# Patient Record
Sex: Female | Born: 1975 | Race: White | Hispanic: No | Marital: Married | Smoking: Never smoker
Health system: Southern US, Community
[De-identification: ages and names within clinical notes are randomized; demographics above are authoritative.]

## PROBLEM LIST (undated history)

## (undated) HISTORY — PX: TONSILLECTOMY: SUR1361

---

## 2017-05-05 ENCOUNTER — Other Ambulatory Visit: Payer: Self-pay

## 2017-05-05 ENCOUNTER — Emergency Department (INDEPENDENT_AMBULATORY_CARE_PROVIDER_SITE_OTHER): Payer: BLUE CROSS/BLUE SHIELD

## 2017-05-05 ENCOUNTER — Emergency Department
Admission: EM | Admit: 2017-05-05 | Discharge: 2017-05-05 | Disposition: A | Discharge status: Home / Self Care | Payer: BLUE CROSS/BLUE SHIELD | Source: Home / Self Care | Attending: Family Medicine | Admitting: Family Medicine

## 2017-05-05 ENCOUNTER — Telehealth: Payer: Self-pay

## 2017-05-05 DIAGNOSIS — R0789 Other chest pain: Secondary | ICD-10-CM

## 2017-05-05 DIAGNOSIS — R079 Chest pain, unspecified: Secondary | ICD-10-CM | POA: Diagnosis not present

## 2017-05-05 LAB — POCT CBC W AUTO DIFF (K'VILLE URGENT CARE)

## 2017-05-05 LAB — POCT URINALYSIS DIP (MANUAL ENTRY)
Bilirubin, UA: NEGATIVE
Glucose, UA: NEGATIVE mg/dL
LEUKOCYTES UA: NEGATIVE
NITRITE UA: NEGATIVE
PH UA: 6 (ref 5.0–8.0)
PROTEIN UA: NEGATIVE mg/dL
Spec Grav, UA: <=1.005 — AB (ref 1.010–1.025)
UROBILINOGEN UA: 0.2 U/dL

## 2017-05-05 LAB — POCT URINE PREGNANCY: PREG TEST UR: NEGATIVE

## 2017-05-05 LAB — D-DIMER, QUANTITATIVE (NOT AT ARMC): D DIMER QUANT: 0.48 ug{FEU}/mL (ref <0.50)

## 2017-05-05 NOTE — ED Provider Notes (Signed)
[facility]    CSN: [id number] Arrival date & time: 05/05/17  1425     History   Chief Complaint Chief Complaint  Patient presents with  . Chest Pain    left side under rib    HPI Denise Caldwell is a 42 y.o. female.   Two days ago patient suddenly developed left flank pain that has persisted.  She points to her left lateral inferior ribs. She has pain with inspiration, chest movement, supine in bed on her left side.  No shortness of breath or cough.  No recent URI.  No fevers, chills, and sweats.  The pain is also somewhat worse after eating.  She denies nausea/vomiting.  No urinary symptoms.  No rash.  No changes in bowel movements.  The history is provided by the patient.  Chest Pain  Pain location:  L lateral chest Pain quality: aching and dull   Pain radiates to:  Does not radiate Pain severity:  Mild Onset quality:  Sudden Duration:  2 days Timing:  Constant Progression:  Unchanged Chronicity:  New Context: breathing, lifting, movement and at rest   Context: not trauma   Relieved by:  Nothing Worsened by:  Coughing, deep breathing and certain positions Ineffective treatments:  None tried Associated symptoms: no abdominal pain, no AICD problem, no anorexia, no back pain, no cough, no diaphoresis, no dysphagia, no fatigue, no fever, no nausea, no near-syncope, no orthopnea, no palpitations, no PND, no shortness of breath, no syncope, no vomiting and no weakness   Risk factors: no prior DVT/PE     History reviewed. No pertinent past medical history.  There are no active problems to display for this patient.   Past Surgical History:  Procedure Laterality Date  . TONSILLECTOMY      OB History   None      Home Medications    Prior to Admission medications   Medication Sig Start Date End Date Taking? Authorizing Provider  cetirizine (ZYRTEC) 10 MG tablet Take 10 mg by mouth daily.   Yes [provider]    Family  History History reviewed. No pertinent family history.  Social History Social History   Tobacco Use  . Smoking status: Never Smoker  . Smokeless tobacco: Never Used  Substance Use Topics  . Alcohol use: Yes  . Drug use: Not Currently     Allergies   Patient has no known allergies.   Review of Systems Review of Systems  Constitutional: Negative for appetite change, chills, diaphoresis, fatigue and fever.  HENT: Negative for trouble swallowing.   Eyes: Negative.   Respiratory: Negative for cough, shortness of breath and wheezing.   Cardiovascular: Positive for chest pain. Negative for palpitations, orthopnea, leg swelling, syncope, PND and near-syncope.  Gastrointestinal: Negative for abdominal pain, anorexia, nausea and vomiting.  Genitourinary: Negative for frequency, hematuria, pelvic pain and vaginal bleeding.  Musculoskeletal: Negative for back pain.  Skin: Negative for rash.  Neurological: Negative for weakness.     Physical Exam Triage Vital Signs ED Triage Vitals  Enc Vitals Group     BP 05/05/17 1445 132/85     Pulse Rate 05/05/17 1445 78     Resp --      Temp 05/05/17 1445 98.3 F (36.8 C)     Temp Source 05/05/17 1445 Oral     SpO2 05/05/17 1445 95 %     Weight 05/05/17 1446 138 lb (62.6 kg)     Height 05/05/17 1446 5' 2.5" (1.588  m)     Head Circumference --      Peak Flow --      Pain Score 05/05/17 1446 5     Pain Loc --      Pain Edu? --      Excl. in GC? --    No data found.  Updated Vital Signs BP 132/85 (BP Location: Right Arm)   Pulse 78   Temp 98.3 F (36.8 C) (Oral)   Ht 5' 2.5" (1.588 m)   Wt 138 lb (62.6 kg)   SpO2 95%   BMI 24.84 kg/m   Visual Acuity Right Eye Distance:   Left Eye Distance:   Bilateral Distance:    Right Eye Near:   Left Eye Near:    Bilateral Near:     Physical Exam  Constitutional: She is oriented to person, place, and time. She appears well-developed. She does not appear ill.  HENT:  Head:  Normocephalic.  Eyes: Pupils are equal, round, and reactive to light. EOM are normal.  Neck: Neck supple.  Cardiovascular: Normal heart sounds.  Pulmonary/Chest: Breath sounds normal. No respiratory distress. She has no wheezes.     She exhibits tenderness.  Area of pain located left inferior ribs as noted on diagram, but there is no tenderness to palpation.    Abdominal: Bowel sounds are normal. There is no tenderness.  Musculoskeletal: She exhibits no edema or tenderness.  Lymphadenopathy:    She has no cervical adenopathy.  Neurological: She is alert and oriented to person, place, and time.  Skin: Skin is warm and dry.  Nursing note and vitals reviewed.    UC Treatments / Results  Labs (all labs ordered are listed, but only abnormal results are displayed) Labs Reviewed  POCT URINALYSIS DIP (MANUAL ENTRY) - Abnormal; Notable for the following components:      Result Value   Ketones, POC UA small (15) (*)    Spec Grav, UA <=1.005 (*)    Blood, UA small (*)    All other components within normal limits  D-DIMER, QUANTITATIVE (NOT AT [facility])  POCT CBC W AUTO DIFF ([facility]):  WBC 7.1; LY 27.5; MO 4.5; GR 68.0; Hgb 13.1; Platelets 258   POCT URINE PREGNANCY negative    EKG None Radiology Dg Chest 2 View  Result Date: 05/05/2017 CLINICAL DATA:  Chest pain EXAM: CHEST - 2 VIEW COMPARISON:  None. FINDINGS: Lungs are clear. Heart size and pulmonary vascularity are normal. No adenopathy. No pneumothorax. No bone lesions. IMPRESSION: No edema or consolidation. Electronically Signed   By: Bretta Bang III M.D.   On: 05/05/2017 15:23    Procedures Procedures (including critical care time)  Medications Ordered in UC Medications - No data to display   Initial Impression / Assessment and Plan / UC Course  I have reviewed the triage vital signs and the nursing notes.  Pertinent labs & imaging results that were available during my care of the patient were reviewed  by me and considered in my medical decision making (see chart for details).    No evidence PE, but will send D-dimer. Suspect musculoskeletal pain.  Also, ?shingles. Treat symptomatically for now: May take Ibuprofen 200mg , 4 tabs every 8 hours with food as needed.  Call if rash develops.   Final Clinical Impressions(s) / UC Diagnoses   Final diagnoses:  Chest wall pain    ED Discharge Orders    None         Lattie Haw,  MD 05/05/17 1714

## 2017-05-05 NOTE — ED Triage Notes (Signed)
Pt has had pain on left side under her ribs since Saturday AM.  It is painful to lay on and hard to take a deep breath.

## 2017-05-05 NOTE — Discharge Instructions (Addendum)
May take Ibuprofen 200mg , 4 tabs every 8 hours with food as needed.  Call if rash develops.

## 2017-05-05 NOTE — Telephone Encounter (Signed)
Called pt and gave D-Dimer results.  Pt stated that she was feeling ok.  Will follow up as needed.

## 2017-05-06 NOTE — Telephone Encounter (Signed)
Spoke with patient and told her Dr.Daub suggested that she return to her PCP or here for repeat D-Dimer. [facility] feeling well [facility]

## 2017-05-07 ENCOUNTER — Other Ambulatory Visit: Payer: Self-pay

## 2017-05-07 ENCOUNTER — Telehealth: Payer: Self-pay | Admitting: *Deleted

## 2017-05-07 ENCOUNTER — Emergency Department
Admission: EM | Admit: 2017-05-07 | Discharge: 2017-05-07 | Disposition: A | Discharge status: Home / Self Care | Payer: BLUE CROSS/BLUE SHIELD | Source: Home / Self Care

## 2017-05-07 LAB — D-DIMER, QUANTITATIVE (NOT AT ARMC): D DIMER QUANT: 0.57 ug{FEU}/mL — AB (ref <0.50)

## 2017-05-07 NOTE — ED Triage Notes (Signed)
Pt is here today for a follow up D dimer blood test.

## 2017-05-07 NOTE — Telephone Encounter (Signed)
Spoke to pt given Denise Caldwell dimer results. Per Dr Cathren Harsh she needs to call her PCP to schedule an appt for a f/u and for her PCP to schedule a CTA. Pt verbalized understanding and will call her PCP now.

## 2017-05-09 MED ORDER — GENERIC EXTERNAL MEDICATION
Status: DC
Start: ? — End: 2017-05-09

## 2017-05-09 MED ORDER — SODIUM CHLORIDE 0.9 % IV SOLN
INTRAVENOUS | Status: DC
Start: ? — End: 2017-05-09

## 2019-10-21 IMAGING — DX DG CHEST 2V
2 series · 2 of 2 positions shown · non-contrast
Comparison: None.

CLINICAL DATA: Chest pain

EXAM:
CHEST - 2 VIEW

[chest pa]
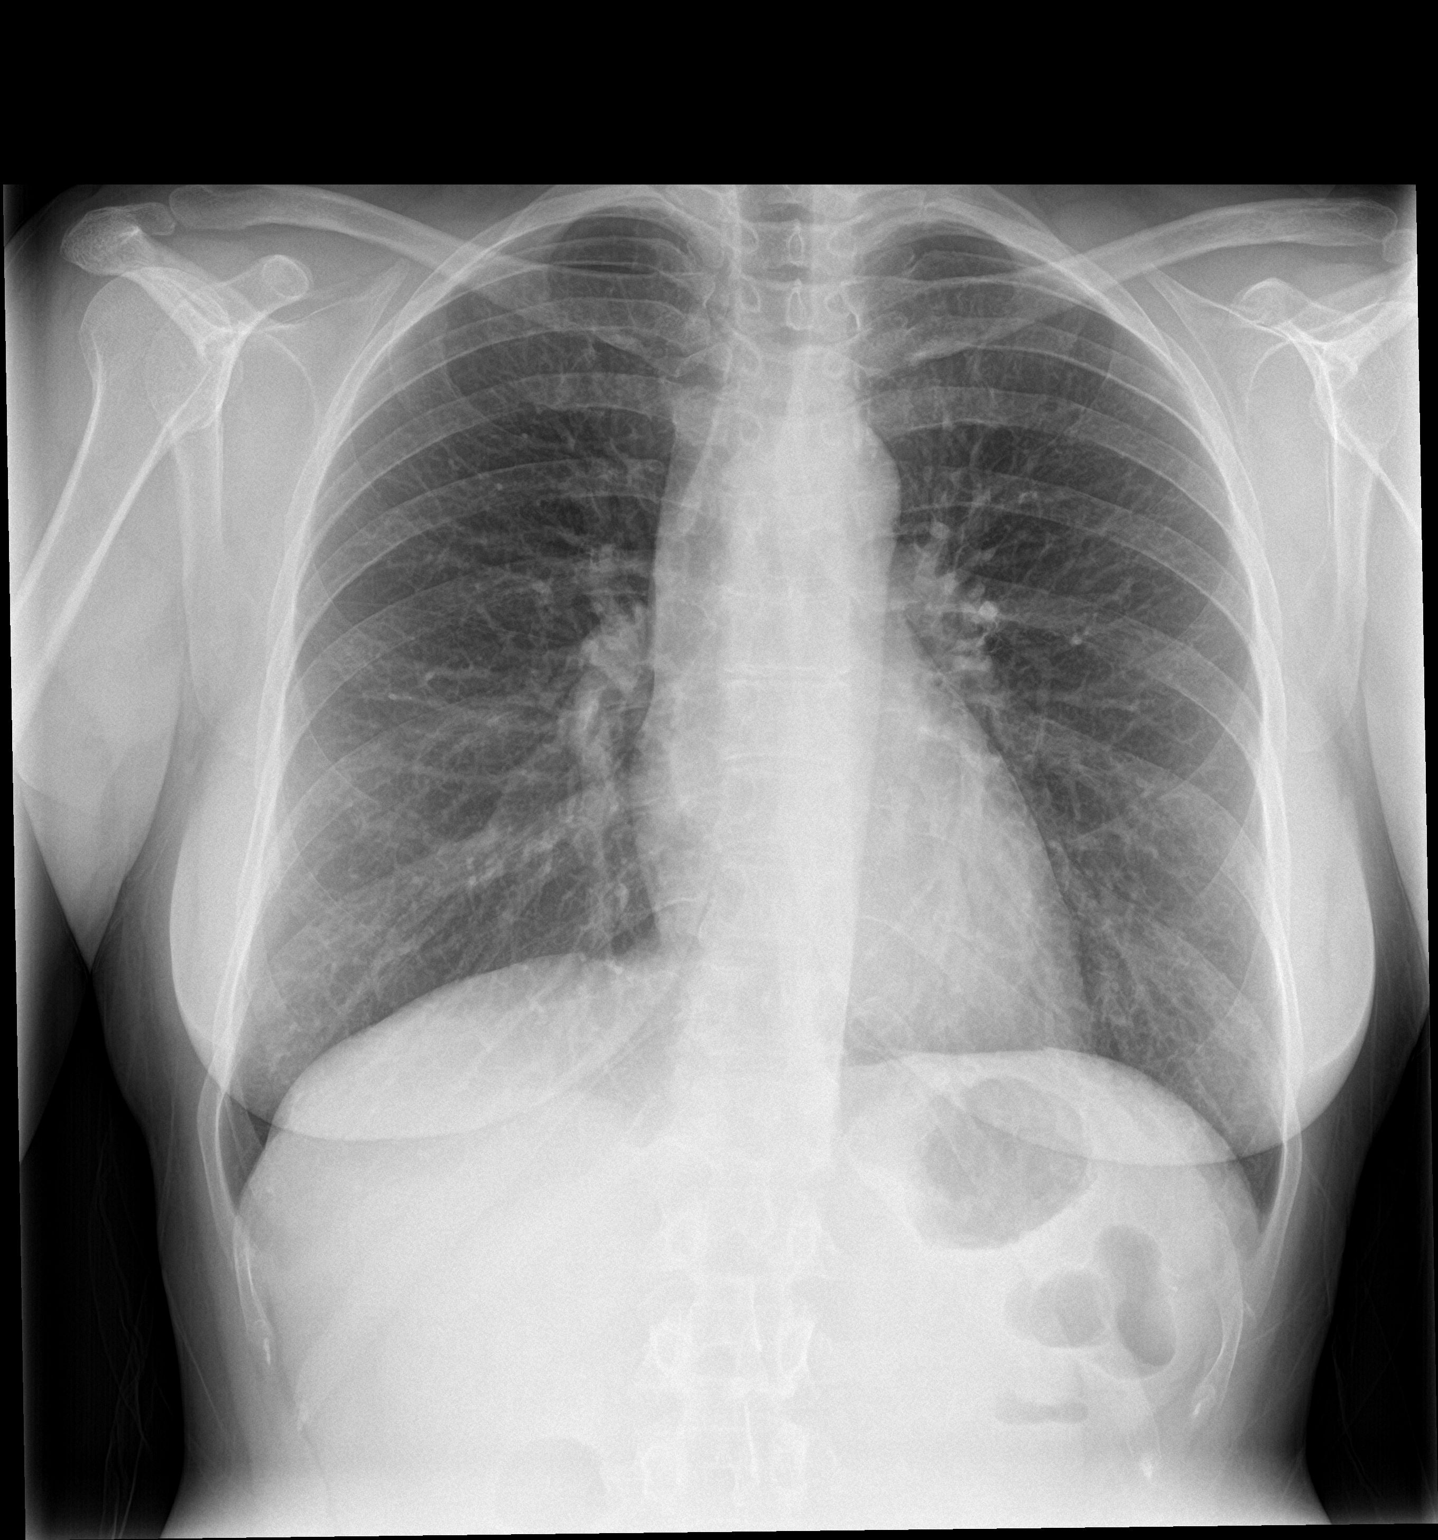

[chest lat]
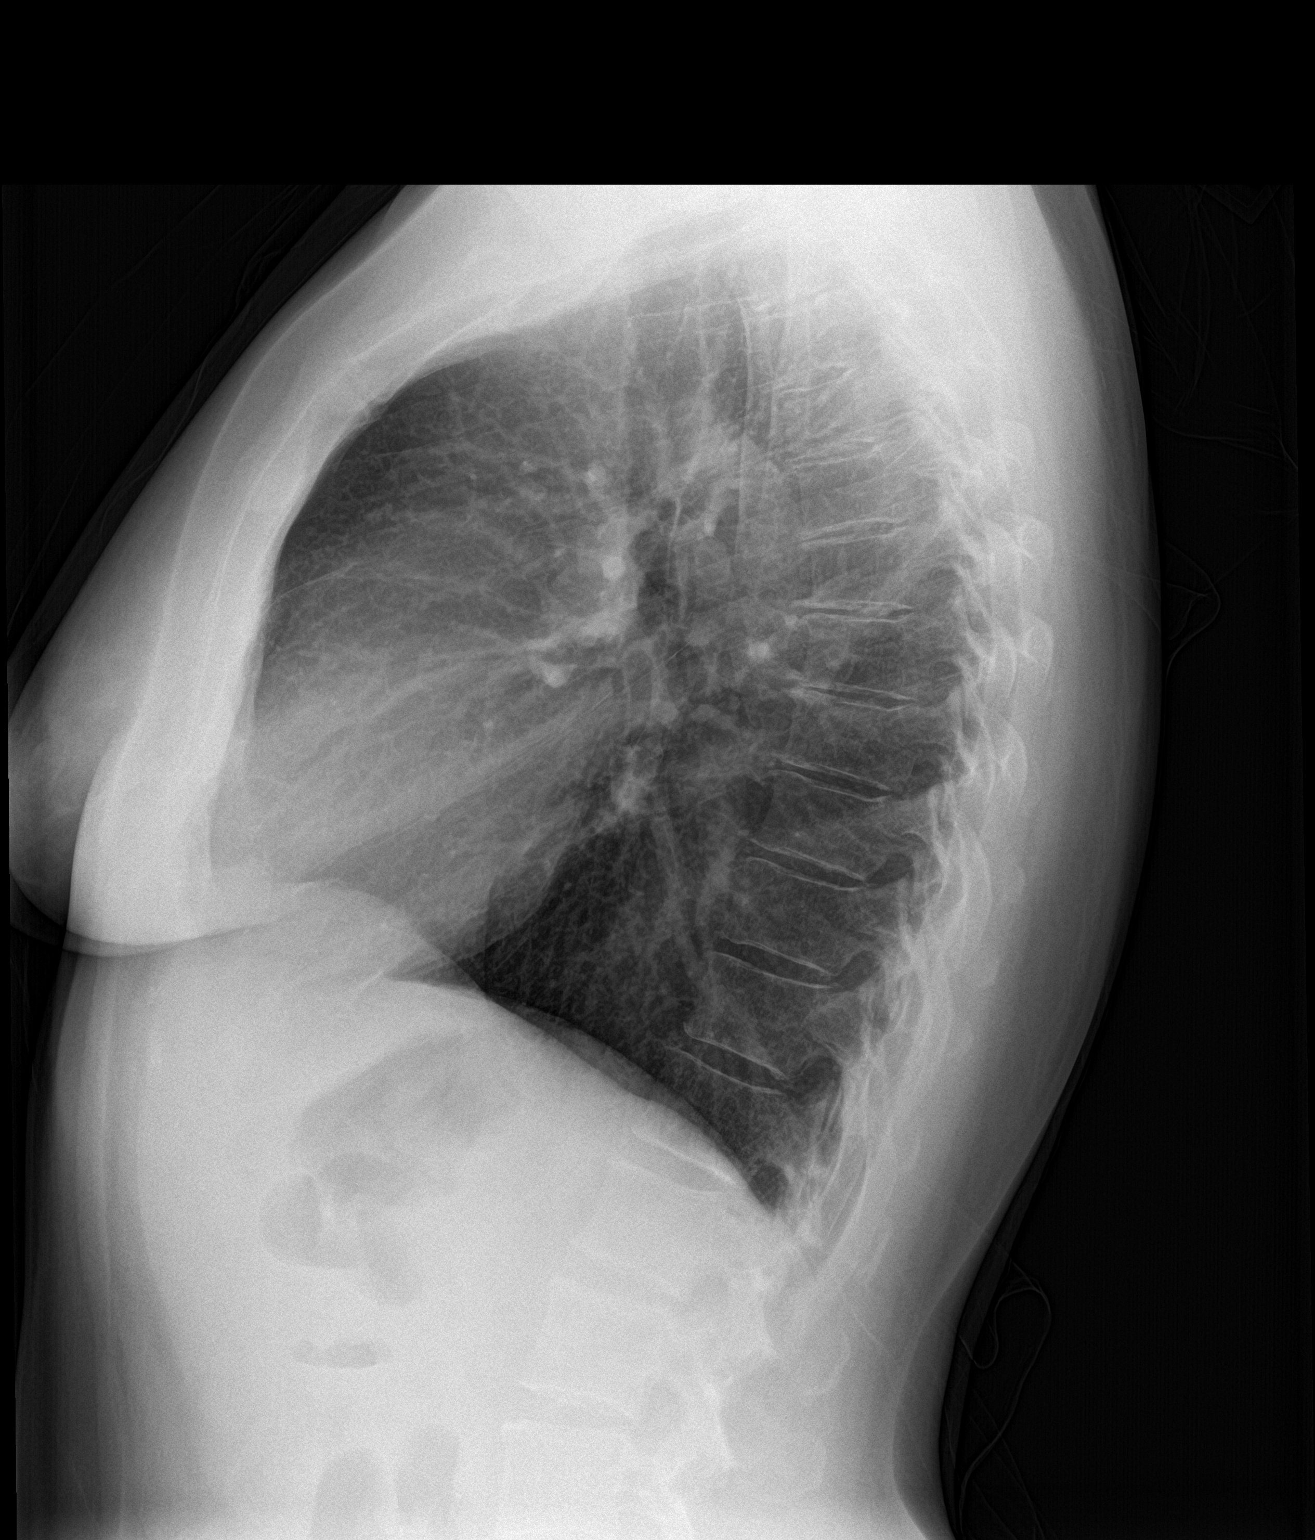

[2 of 2 positions shown; findings below may reference images not displayed]

FINDINGS: Lungs are clear. Heart size and pulmonary vascularity are normal. No
adenopathy. No pneumothorax. No bone lesions.
IMPRESSION: No edema or consolidation.
